# Patient Record
Sex: Female | Born: 1935
Health system: Southern US, Community
[De-identification: ages and names within clinical notes are randomized; demographics above are authoritative.]

---

## 2004-10-23 ENCOUNTER — Ambulatory Visit: Payer: Self-pay | Admitting: Internal Medicine

## 2005-10-31 ENCOUNTER — Ambulatory Visit: Payer: Self-pay | Admitting: Internal Medicine

## 2007-02-10 ENCOUNTER — Ambulatory Visit: Payer: Self-pay | Admitting: Internal Medicine

## 2008-02-11 ENCOUNTER — Ambulatory Visit: Payer: Self-pay | Admitting: Internal Medicine

## 2009-02-13 ENCOUNTER — Ambulatory Visit: Payer: Self-pay | Admitting: Internal Medicine

## 2009-08-29 ENCOUNTER — Ambulatory Visit: Payer: Self-pay | Admitting: Internal Medicine

## 2012-12-24 ENCOUNTER — Inpatient Hospital Stay: Payer: Self-pay | Admitting: Internal Medicine

## 2012-12-24 LAB — COMPREHENSIVE METABOLIC PANEL
Albumin: 4.3 g/dL (ref 3.4–5.0)
Anion Gap: 3 — ABNORMAL LOW (ref 7–16)
BUN: 8 mg/dL (ref 7–18)
Bilirubin,Total: 0.4 mg/dL (ref 0.2–1.0)
Chloride: 96 mmol/L — ABNORMAL LOW (ref 98–107)
Creatinine: 0.74 mg/dL (ref 0.60–1.30)
EGFR (African American): >60
EGFR (Non-African Amer.): >60
Osmolality: 257 (ref 275–301)
SGOT(AST): 23 U/L (ref 15–37)
Sodium: 129 mmol/L — ABNORMAL LOW (ref 136–145)

## 2012-12-24 LAB — URINALYSIS, COMPLETE
Glucose,UR: NEGATIVE mg/dL (ref 0–75)
Ph: 7 (ref 4.5–8.0)
RBC,UR: 2 /HPF (ref 0–5)
Specific Gravity: 1.005 (ref 1.003–1.030)
WBC UR: 2 /HPF (ref 0–5)

## 2012-12-24 LAB — CBC
HCT: 39.8 % (ref 35.0–47.0)
HGB: 13.6 g/dL (ref 12.0–16.0)
MCH: 31.1 pg (ref 26.0–34.0)
MCV: 91 fL (ref 80–100)
Platelet: 201 10*3/uL (ref 150–440)
RBC: 4.37 10*6/uL (ref 3.80–5.20)

## 2012-12-25 LAB — CBC WITH DIFFERENTIAL/PLATELET
Basophil #: 0 10*3/uL (ref 0.0–0.1)
Basophil %: 0.6 %
Eosinophil %: 2.5 %
HCT: 38.4 % (ref 35.0–47.0)
HGB: 13.1 g/dL (ref 12.0–16.0)
Lymphocyte #: 1.3 10*3/uL (ref 1.0–3.6)
MCV: 91 fL (ref 80–100)
Monocyte #: 0.4 x10 3/mm (ref 0.2–0.9)
Neutrophil #: 4.5 10*3/uL (ref 1.4–6.5)

## 2012-12-25 LAB — BASIC METABOLIC PANEL
Chloride: 106 mmol/L (ref 98–107)
Creatinine: 0.87 mg/dL (ref 0.60–1.30)
EGFR (Non-African Amer.): >60
Glucose: 86 mg/dL (ref 65–99)
Osmolality: 274 (ref 275–301)
Potassium: 3.7 mmol/L (ref 3.5–5.1)

## 2012-12-26 LAB — URINE CULTURE

## 2012-12-27 LAB — URINE CULTURE

## 2013-03-13 LAB — LIPASE, BLOOD: Lipase: 77 U/L (ref 73–393)

## 2013-03-13 LAB — CBC
HCT: 39.4 % (ref 35.0–47.0)
MCHC: 35.5 g/dL (ref 32.0–36.0)
MCV: 90 fL (ref 80–100)
Platelet: 204 10*3/uL (ref 150–440)
RBC: 4.37 10*6/uL (ref 3.80–5.20)
RDW: 13.5 % (ref 11.5–14.5)
WBC: 11.1 10*3/uL — ABNORMAL HIGH (ref 3.6–11.0)

## 2013-03-13 LAB — COMPREHENSIVE METABOLIC PANEL
Albumin: 3.2 g/dL — ABNORMAL LOW (ref 3.4–5.0)
Alkaline Phosphatase: 78 U/L (ref 50–136)
BUN: 17 mg/dL (ref 7–18)
Bilirubin,Total: 1 mg/dL (ref 0.2–1.0)
Co2: 27 mmol/L (ref 21–32)
Creatinine: 0.94 mg/dL (ref 0.60–1.30)
EGFR (African American): >60
EGFR (Non-African Amer.): 57 — ABNORMAL LOW
Osmolality: 258 (ref 275–301)
Potassium: 3.6 mmol/L (ref 3.5–5.1)
SGOT(AST): 18 U/L (ref 15–37)

## 2013-03-14 ENCOUNTER — Inpatient Hospital Stay: Payer: Self-pay | Admitting: Surgery

## 2013-03-14 LAB — URINALYSIS, COMPLETE
Bacteria: NONE SEEN
Bilirubin,UR: NEGATIVE
Glucose,UR: NEGATIVE mg/dL (ref 0–75)
Leukocyte Esterase: NEGATIVE
Nitrite: NEGATIVE
Ph: 5 (ref 4.5–8.0)
Protein: 30
RBC,UR: 3 /HPF (ref 0–5)
Specific Gravity: 1.019 (ref 1.003–1.030)
Squamous Epithelial: 1
WBC UR: 1 /HPF (ref 0–5)

## 2013-03-14 LAB — SODIUM: Sodium: 132 mmol/L — ABNORMAL LOW (ref 136–145)

## 2013-03-15 LAB — BASIC METABOLIC PANEL
BUN: 9 mg/dL (ref 7–18)
Calcium, Total: 8.2 mg/dL — ABNORMAL LOW (ref 8.5–10.1)
Co2: 28 mmol/L (ref 21–32)
Creatinine: 0.93 mg/dL (ref 0.60–1.30)
EGFR (African American): >60
EGFR (Non-African Amer.): 58 — ABNORMAL LOW
Glucose: 111 mg/dL — ABNORMAL HIGH (ref 65–99)
Osmolality: 268 (ref 275–301)
Potassium: 3.3 mmol/L — ABNORMAL LOW (ref 3.5–5.1)

## 2013-03-15 LAB — CBC WITH DIFFERENTIAL/PLATELET
Basophil #: 0 10*3/uL (ref 0.0–0.1)
Basophil %: 0.3 %
Eosinophil #: 0.1 10*3/uL (ref 0.0–0.7)
Eosinophil %: 0.8 %
HCT: 35.2 % (ref 35.0–47.0)
Lymphocyte #: 0.8 10*3/uL — ABNORMAL LOW (ref 1.0–3.6)
MCH: 32.1 pg (ref 26.0–34.0)
MCHC: 35.2 g/dL (ref 32.0–36.0)
Monocyte #: 0.7 x10 3/mm (ref 0.2–0.9)
Monocyte %: 9 %
Neutrophil #: 6.2 10*3/uL (ref 1.4–6.5)
Platelet: 170 10*3/uL (ref 150–440)
RDW: 13.5 % (ref 11.5–14.5)
WBC: 7.7 10*3/uL (ref 3.6–11.0)

## 2013-03-15 LAB — HEPATIC FUNCTION PANEL A (ARMC)
Bilirubin, Direct: 0.1 mg/dL (ref 0.00–0.20)
SGPT (ALT): 34 U/L (ref 12–78)
Total Protein: 5.5 g/dL — ABNORMAL LOW (ref 6.4–8.2)

## 2013-03-16 LAB — BASIC METABOLIC PANEL
Anion Gap: 7 (ref 7–16)
Calcium, Total: 8.6 mg/dL (ref 8.5–10.1)
Chloride: 99 mmol/L (ref 98–107)
Co2: 29 mmol/L (ref 21–32)
Creatinine: 0.85 mg/dL (ref 0.60–1.30)
EGFR (Non-African Amer.): >60
Glucose: 83 mg/dL (ref 65–99)
Osmolality: 267 (ref 275–301)
Potassium: 3.6 mmol/L (ref 3.5–5.1)
Sodium: 135 mmol/L — ABNORMAL LOW (ref 136–145)

## 2013-03-16 LAB — CBC WITH DIFFERENTIAL/PLATELET
Basophil #: 0 10*3/uL (ref 0.0–0.1)
Eosinophil #: 0.3 10*3/uL (ref 0.0–0.7)
HCT: 37.2 % (ref 35.0–47.0)
HGB: 12.9 g/dL (ref 12.0–16.0)
Lymphocyte %: 18.1 %
Monocyte %: 6.7 %
Neutrophil #: 5.2 10*3/uL (ref 1.4–6.5)
Platelet: 196 10*3/uL (ref 150–440)
RDW: 13.5 % (ref 11.5–14.5)
WBC: 7.3 10*3/uL (ref 3.6–11.0)

## 2013-11-17 ENCOUNTER — Emergency Department: Payer: Self-pay | Admitting: Emergency Medicine

## 2013-11-18 LAB — COMPREHENSIVE METABOLIC PANEL
ALBUMIN: 3.2 g/dL — AB (ref 3.4–5.0)
Alkaline Phosphatase: 100 U/L
Anion Gap: 6 — ABNORMAL LOW (ref 7–16)
BILIRUBIN TOTAL: 0.3 mg/dL (ref 0.2–1.0)
BUN: 24 mg/dL — AB (ref 7–18)
CREATININE: 1.16 mg/dL (ref 0.60–1.30)
Calcium, Total: 8.9 mg/dL (ref 8.5–10.1)
Chloride: 104 mmol/L (ref 98–107)
Co2: 29 mmol/L (ref 21–32)
EGFR (African American): 51 — ABNORMAL LOW
EGFR (Non-African Amer.): 44 — ABNORMAL LOW
Glucose: 105 mg/dL — ABNORMAL HIGH (ref 65–99)
Osmolality: 282 (ref 275–301)
POTASSIUM: 3.9 mmol/L (ref 3.5–5.1)
SGOT(AST): 17 U/L (ref 15–37)
SGPT (ALT): 20 U/L (ref 12–78)
Sodium: 139 mmol/L (ref 136–145)
Total Protein: 7 g/dL (ref 6.4–8.2)

## 2013-11-18 LAB — URINALYSIS, COMPLETE
Bilirubin,UR: NEGATIVE
Glucose,UR: NEGATIVE mg/dL (ref 0–75)
KETONE: NEGATIVE
Nitrite: NEGATIVE
Ph: 6 (ref 4.5–8.0)
Protein: NEGATIVE
RBC,UR: 1 /HPF (ref 0–5)
Specific Gravity: 1.005 (ref 1.003–1.030)

## 2013-11-18 LAB — CBC WITH DIFFERENTIAL/PLATELET
BASOS ABS: 0 10*3/uL (ref 0.0–0.1)
Basophil %: 0.6 %
EOS ABS: 0.3 10*3/uL (ref 0.0–0.7)
Eosinophil %: 4.9 %
HCT: 38.6 % (ref 35.0–47.0)
HGB: 12.6 g/dL (ref 12.0–16.0)
LYMPHS ABS: 2 10*3/uL (ref 1.0–3.6)
LYMPHS PCT: 33.4 %
MCH: 30.2 pg (ref 26.0–34.0)
MCHC: 32.6 g/dL (ref 32.0–36.0)
MCV: 93 fL (ref 80–100)
MONO ABS: 0.5 x10 3/mm (ref 0.2–0.9)
Monocyte %: 8.6 %
Neutrophil #: 3.1 10*3/uL (ref 1.4–6.5)
Neutrophil %: 52.5 %
PLATELETS: 165 10*3/uL (ref 150–440)
RBC: 4.17 10*6/uL (ref 3.80–5.20)
RDW: 13.8 % (ref 11.5–14.5)
WBC: 6 10*3/uL (ref 3.6–11.0)

## 2013-11-18 LAB — LIPASE, BLOOD: Lipase: 137 U/L (ref 73–393)

## 2013-11-20 LAB — URINE CULTURE

## 2014-08-28 ENCOUNTER — Inpatient Hospital Stay: Payer: Self-pay | Admitting: Internal Medicine

## 2014-11-04 NOTE — Consult Note (Signed)
General Aspect 81 y/o f with hx of Dementia, Hypothyroidism   Present Illness Admitted with recurrent episodes of abdominal pain and nausea or the last 3-4 days U/s shows evidence for disteneded gall bladder and impacted stone in neck. Pt denies fevers, chills, chest pains or shortness of breath EKG: SR. Non specific ST changes     ED ECG, Stat-Interpreted by-.ordering physician-Reason for ecg -abd pain-Show to MD Immediately if over 24 years old., 13-Mar-2013, Active, Standard   No Known Allergies:   Case History and Physical Exam:  Chief Complaint Abdominal Pain  Nausea/Vomiting   Past Medical Health Other, Hypothyroidism, Dementia   Past Surgical History D &C after Miscarriage   Primary Care Provider [facility]   Family History Coronary Artery Disease  Hypertension  Diabetes Mellitus  Cancer   HEENT PERLA   Neck/Nodes Supple   Chest/Lungs Clear   Cardiovascular No Murmurs or Gallops  Normal Sinus Rhythm   Abdomen Benign  Active bowel sounds  No hernias   Musculoskeletal Full range of motion   Neurological Grossly WNL  Dementia   Skin Warm  WNL   Nursing/Ancillary Notes: **Vital Signs.:   31-Aug-14 07:59  Temperature Temperature (F) 98.1  Celsius 36.7  Temperature Source oral  Pulse Pulse 79  Respirations Respirations 18  Systolic BP Systolic BP 546  Diastolic BP (mmHg) Diastolic BP (mmHg) 68  Mean BP 92  Pulse Ox % Pulse Ox % 96  Oxygen Delivery Room Air/ 21 %   Hepatic:  30-Aug-14 23:32   Bilirubin, Total 1.0  Alkaline Phosphatase 78  SGPT (ALT) 16  SGOT (AST) 18  Total Protein, Serum 6.6  Albumin, Serum  3.2  Routine Chem:  30-Aug-14 23:32   Lipase 77 (Result(s) reported on 13 Mar 2013 at 11:52PM.)  Glucose, Serum  118  BUN 17  Creatinine (comp) 0.94  Sodium, Serum  127  Potassium, Serum 3.6  Chloride, Serum  92  CO2, Serum 27  Calcium (Total), Serum 8.6  Osmolality (calc) 258  eGFR (African American) >60  eGFR  (Non-African American)  57 (eGFR values <49m/min/1.73 m2 may be an indication of chronic kidney disease (CKD). Calculated eGFR is useful in patients with stable renal function. The eGFR calculation will not be reliable in acutely ill patients when serum creatinine is changing rapidly. It is not useful in  patients on dialysis. The eGFR calculation may not be applicable to patients at the low and high extremes of body sizes, pregnant women, and vegetarians.)  Anion Gap 8  Cardiac:  30-Aug-14 23:32   Troponin I < 0.02 (0.00-0.05 0.05 ng/mL or less: NEGATIVE  Repeat testing in 3-6 hrs  if clinically indicated. >0.05 ng/mL: POTENTIAL  MYOCARDIAL INJURY. Repeat  testing in 3-6 hrs if  clinically indicated. NOTE: An increase or decrease  of 30% or more on serial  testing suggests a  clinically important change)  Routine UA:  31-Aug-14 23:41   Color (UA) Yellow  Clarity (UA) Clear  Glucose (UA) Negative  Bilirubin (UA) Negative  Ketones (UA) 1+  Specific Gravity (UA) 1.019  Blood (UA) 2+  pH (UA) 5.0  Protein (UA) 30 mg/dL  Nitrite (UA) Negative  Leukocyte Esterase (UA) Negative (Result(s) reported on 14 Mar 2013 at 12:35AM.)  RBC (UA) 3 /HPF  WBC (UA) 1 /HPF  Bacteria (UA) NONE SEEN  Epithelial Cells (UA) 1 /HPF  Mucous (UA) PRESENT (Result(s) reported on 14 Mar 2013 at 12:35AM.)  Routine Hem:  30-Aug-14 23:32  WBC (CBC)  11.1  RBC (CBC) 4.37  Hemoglobin (CBC) 14.0  Hematocrit (CBC) 39.4  Platelet Count (CBC) 204 (Result(s) reported on 13 Mar 2013 at 11:45PM.)  MCV 90  MCH 32.1  MCHC 35.5  RDW 13.5   [place]:    31-Aug-14 02:40, US Abdomen Limited Survey  US Abdomen Limited Survey   REASON FOR EXAM:    RUQ PAIN  COMMENTS:   Body Site: GB and Fossa, CBD, Head of Pancreas    PROCEDURE: [place]  - US ABDOMEN LIMITED SURVEY  - Mar 14 2013  2:40AM     RESULT: A limited upper abdominal ultrasound examination was performed.    The gallbladder is distended and there is a  calcified stone which appears   impacted in the neck. An additional large stones are demonstrated. The   impacted stone measures 1.9 cm in greatest dimension. There is no   positive sonographic Murphy's sign. There is pericholecystic fluid and   there is mild gallbladder wall thickening to 4 0.4 mm. The common bile   duct is normal at 1.7 mm in diameter.    The liver exhibits normal echotexture with no focal mass or ductal     dilation. Portal venous flow is normal in direction toward the liver. The   pancreas demonstrates normal echotexture and contour.    IMPRESSION:   1. There is gallbladder distention with an impacted stone in the neck   with other stones present. There is gallbladder wall thickening and   pericholecystic fluid. Though there is no positive sonographic Murphy's   sign findings suggest cholelithiasis and cholecystitis.  2. The liver and pancreas and common bile duct exhibit no acute   abnormalities.     Dictation Site: 1        Verified [place]:OVZCH A. [place], M.D., MD    Impression Nausea and vomiting secondary to gall stones. Dementia: Stable- On Divalproex and prn Haldol and Ativan Hypothyroidism: On Levothyroxine Hyponatremia;  May be related to meds -Continue to monitor. Change IV fluids to N.saline   Plan Pt is stable to undergo surgery. No further cardiac work up indicated at this time Risk of peri operative complications is moderate given her age and underlying medical condition   Electronic Signatures: Tracie Harrier (MD)  (Signed 31-Aug-14 10:37)  Authored: General Aspect/Present Illness, Orders, Allergies, History and Physical Exam, Vital Signs, Labs, Radiology, Impression/Plan   Last Updated: 31-Aug-14 10:37 by Tracie Harrier (MD)

## 2014-11-04 NOTE — Discharge Summary (Signed)
PATIENT NAME:  Julia Gray, [name] MR#:  [id number] DATE OF BIRTH:  [birth year]  DATE OF ADMISSION:  03/14/2013 DATE OF DISCHARGE:  03/16/2013  PREOPERATIVE DIAGNOSIS: Acute cholecystitis.   ADDITIONAL DISCHARGE DIAGNOSES: 1.  Possible hysterectomy.  2.  Hypothyroidism.  3.  History of mental status changes.  4.  History of hyponatremia.   DISCHARGE MEDICATIONS:  1.  Vitamin D3 50,000 units 1 tab p.o. q. weekly on Thursday.  2.  L-thyroxine 75 mcg p.o. daily.  3.  Aspirin 81 mg p.o. daily.  4.  Depakote sprinkles 125 mg p.o. b.i.d.   5.  Zyrtec 5 mg p.o. daily. 6.  Ativan 0.5 mg b.i.d. p.r.n. anxiety and nervousness. 7.  Trazodone 100 mg p.o. at bedtime.  8.  Norco 1 tab p.o. q. 4 hours p.r.n. pain.   INDICATION FOR ADMISSION: [name] is a pleasant 81 year old female with history of recurrent abdominal pain and was noted to have an impacted cystic duct stone on ultrasound and mild leukocytosis. She was thus brought to the operating room suite for laparoscopic cholecystectomy.   HOSPITAL COURSE:  [name] was admitted on 08/31 and was given IV fluids and IV antibiotics. She later underwent laparoscopic cholecystectomy. Throughout hospital course, her diet was advanced to regular and she was transitioned to p.o. pain meds. At the time of discharge, she was not taking ideal p.o. intake, but had no nausea or vomiting. Did not have a bowel movement for the past few days so is being treated with Dulcolax suppository, but otherwise in satisfactory condition.   DISCHARGE INSTRUCTIONS: [name] was to follow with me in approximately 1 week. She is to call or return to the ED if she has increased pain, nausea, vomiting, redness or drainage from incision.  ____________________________ Si Raiderhristopher A. Shaqueta Casady, MD cal:sb D: 03/16/2013 10:54:04 ET T: 03/16/2013 11:09:33 ET JOB#: [id number]  cc: Cristal Deerhristopher A. Armondo Cech, MD, <Dictator> Jarvis NewcomerHRISTOPHER A Yonatan Guitron MD ELECTRONICALLY SIGNED  03/19/2013 8:13

## 2014-11-04 NOTE — H&P (Signed)
PATIENT NAME:  Julia Gray, Julia Gray MR#:  [id number] DATE OF BIRTH:  Oct 25, 1930  DATE OF ADMISSION:  03/14/2013  PRIMARY CARE PHYSICIAN:  John B. Danne Harbor, M.D.    ADMITTING PHYSICIAN:  Quentin Ore, III, M.D.  CHIEF COMPLAINT: Abdominal pain.   BRIEF HISTORY: Julia Gray is an 81 year old woman seen in the Emergency Room with a several day history of abdominal pain, nausea and vomiting. She lives in an assisted living facility because of her current dementia. The facility notified the family today that she had been having nausea, vomiting over the last several days, but implied that the symptoms were intermittent. The patient was seen in [facility] today. She was noted to have some blood in her urine and, therefore, treated as a urinary tract infection placing the patient on antibiotics. She went back to the assisted living facility. The family was notified this evening that she was continuing to have abdominal pain, in addition to her nausea, vomiting, and the on-call physician suggested the Emergency Room evaluation.   She presented to the Emergency Room for further investigation. Workup revealed a slightly elevated white blood cell count at 11,000. Electrolytes were unremarkable with the exception of sodium of 127. Liver function studies were unremarkable. Because of her abdominal pain, nausea and vomiting, ultrasound was performed which demonstrated cholelithiasis and a possible impacted cystic duct stone. There was no sign of any pericholecystic fluid, gallbladder wall thickening or sonographic Murphy's sign. The surgical service was consulted.   The patient is a very poor historian from her dementia. Her son is present and assisted with the interview. She has no known history of biliary tract disease. She has no history of other GI problems, including hepatitis, yellow jaundice, pancreatitis, peptic ulcer disease, previous diagnosis of gallbladder disease or  diverticulitis. Her chart carries no surgical history, although the son believes she had some sort of surgery almost 50 years ago. He is concerned it may have been a hysterectomy. She has no history of cardiac disease. She has no hypertension or diabetes, but she is hypothyroid. She has history of previous admission this year for mental status changes and hyponatremia. She was moved to an assisted  living facility at that time. She is regularly  followed by Dr. Yates Decamp at the [facility].    ADMISSION MEDICATIONS:  Include aspirin 81 mg once a day, Depakote sprinkles 125 mg b.i.d., levothyroxine 0.075 mg once a day, lorazepam 0.5 twice a day p.r.n., trazodone 100 mg at bedtime, Tylenol 650 2 tablets every 6 hours p.r.n. and Zyrtec 10 mg 1/2 tablet once a day.    ALLERGIES:  She is not allergic to any medications.   SOCIAL HISTORY: She is a former smoker, but has not smoked in years.   REVIEW OF SYSTEMS: Impossible in in this setting because of the patient's dementia. The son is unaware of any other symptoms or nor any problems with her urinary tract function at this time.   PHYSICAL EXAMINATION: GENERAL: She is lying comfortably in bed. She is cooperative, but confused.  VITAL SIGNS: Blood pressure is 131/68. Heart rate is 82 and regular. She denies any pain.  HEENT: No scleral icterus. No pupillary abnormalities. No facial deformities.  NECK: Supple, nontender with midline trachea and no adenopathy.  CHEST: Clear with no adventitious sounds and normal pulmonary excursion.  CARDIAC: No murmurs or gallops to my ear, and seems to be in normal sinus rhythm. She has no carotid bruits.  ABDOMINAL:  Reveals some moderate right upper quadrant, right flank tenderness. I cannot palpate her gallbladder. She has hypoactive, but present bowel sounds. She guards on the right side, but has no rebound and no hernias are noted.  EXTREMITIES: Full range of motion. No deformities, good distal pulses.   PSYCHIATRIC: Reveals very poor orientation, but a cooperative dementia. Her affect is flat.   ASSESSMENT AND PLAN: This presentation would suggest possible biliary tract disease. We will admit her to the hospital, place her on IV antibiotics, rehydrate her hyponatremia and have t medicine see her with regard to risk assessment for surgery. I discussed this plan with the patient's son and he is in agreement.    ____________________________ Carmie Endalph L. Ely III, MD rle:nts D: 03/14/2013 04:47:40 ET T: 03/14/2013 05:32:49 ET JOB#: [id number]  cc: Quentin Orealph L. Ely III, MD, <Dictator> Quentin OreALPH L ELY MD ELECTRONICALLY SIGNED 03/15/2013 4:29

## 2014-11-04 NOTE — Discharge Summary (Signed)
PATIENT NAME:  Julia Gray, Julia Gray MR#:  [id number] DATE OF BIRTH:  May 14, 1931  DATE OF ADMISSION:  12/24/2012 DATE OF DISCHARGE:  12/28/2012  HISTORY OF PRESENT ILLNESS:  Julia Gray is a 81 year old white lady with known severe dementia who was brought to the Emergency Room after she had a sudden change in mental status. The patient had been extremely confused and was hallucinating. In the Emergency Room, the patient was found to have mild hyponatremia and evidence of a urinary tract infection.   PAST MEDICAL HISTORY: Notable for: 1.  Senile dementia.  2.  Depression.  3.  Osteoporosis.  4.  Hypothyroidism.   ALLERGIES: No known drug allergies.   HOME MEDICATIONS: Include Aleve 220 mg daily, Synthroid 75 mcg daily, Lorazepam 0.5 mg every 6 hours p.r.n. anxiety, vitamin D 50,000 units once a week on Thursdays and Zyrtec 10 mg daily.   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 She had not smoked in many years. She did not use any alcohol. She was currently living with her daughter, but was left unattended for long periods during the day as her family all work.   ADMISSION PHYSICAL EXAMINATION: Revealed a temperature 97.9, a pulse of 74, respirations of 16 and a blood pressure of 163/72. Pulse ox was 98% on room air. Examination as described by the admitting physician was most notable for a grade 3/6 systolic murmur. Specifically, neurological examination was unremarkable. The patient was noted to be alert, but oriented to self only.   LABORATORY AND DIAGNOSTICS: The patient's admission CBC showed a hemoglobin of 13.6 with a hematocrit of 39.8. White count was 6400. Platelet count was 201,000. Admission comprehensive metabolic panel was notable only for a sodium of 129 and a chloride of 96. TSH was 2.02. Urinalysis showed 1+ leukocyte esterase.   Chest x-ray showed possible underlying COPD, but was otherwise unremarkable. Carotid Doppler's showed no significant  stenosis. Unenhanced head CT in the Emergency Room showed diffuse cerebral and cerebellar atrophy, but no acute changes.   HOSPITAL COURSE: The patient was admitted to the regular floor where her hyponatremia was corrected with saline IV. Blood and urine cultures were obtained on admission and the patient was started on Septra double strength b.i.d. for suspected urinary tract infection. Blood cultures eventually showed no growth. Urine culture unfortunately showed mixed organisms suggestive of contamination. The patient's hallucinations, etc., resolved during her hospitalization but she remained severely demented and became agitated at times. This was controlled with p.o. Haldol. The patient was also seen during her hospitalization by physical therapy for reconditioning. The patient's final chemistry panel showed a sodium of 138 with a chloride of 106. The remainder of the panel was also within normal limits.   DISCHARGE DIAGNOSES: 1.  Acute metabolic encephalopathy secondary to hyponatremia and urinary tract infection.  2.  Urinary tract infection.  3.  Hyponatremia, resolved.  4.  Senile dementia.  5.  Hypothyroidism.  6.  Osteoporosis.   DISCHARGE MEDICATIONS: 1.  Tylenol 650 mg q. 4 hours as needed for pain or fever.  2.  Aspirin 81 mg daily.  3.  Vitamin D2 50,000 units weekly.  4.  Levoxyl 75 mcg daily.  5.  Protonix 40 mg daily.  6.  Trazodone 50 mg at bedtime.  7.  Bactrim double strength 1 tablet b.i.d. for an additional 3 days.  8.  Haldol 2 mg p.o. q. 2 hours p.r.n. agitation.   DISCHARGE DISPOSITION: The patient is being  discharged on a regular diet with activity as tolerated. She does remain a FULL CODE.  ____________________________ Letta PateJohn B. Danne HarborWalker III, MD jbw:sb D: 12/28/2012 07:22:04 ET T: 12/28/2012 07:35:00 ET JOB#: [id number]  cc: Letta PateJohn B. Danne HarborWalker III, MD, <Dictator> Elmo PuttJOHN B WALKER III MD ELECTRONICALLY SIGNED 01/04/2013 22:27

## 2014-11-04 NOTE — Op Note (Signed)
PATIENT NAME:  Julia HeinzMCPHERSON, Julia H MR#:  [id number] DATE OF BIRTH:  1930-09-01  DATE OF PROCEDURE:  03/14/2013  PREOPERATIVE DIAGNOSIS: Acute cholecystitis.   POSTOPERATIVE DIAGNOSIS: Acute on chronic cholecystitis.   PROCEDURE PERFORMED: Laparoscopic cholecystectomy.   SURGEON: Seanmichael Salmons A. Stacee Earp, M.D.   ANESTHESIA: General.   ESTIMATED BLOOD LOSS: 50 mL.   SPECIMEN: Gallbladder.   INDICATION FOR SURGERY: [name] is a pleasant 81 year old female who presents with recurrent epigastric and right upper quadrant pain. She was noted to have a nonmobile stone in the neck of her gallbladder and gallbladder wall thickening. She was thus brought to the operating room for laparoscopic cholecystectomy.   DETAILS OF PROCEDURE: Informed consent was obtained. [name] was laid supine on the operating room table. She was induced. Endotracheal tube was placed. General anesthesia was administered. Her abdomen was then prepped and draped in standard surgical fashion. A timeout was then performed correctly identifying the patient name, operative site and procedure to be performed. A supraumbilical incision was made. This was deepened down to the fascia. The fascia was incised. The peritoneum was entered. Two stay sutures were placed through the fascia. Hassan trocar was placed in the abdomen. The abdomen was insufflated. The gallbladder was evaluated. It was noted to be taut and fluid-filled and erythematous. I thus placed an 11 mm epigastric port and two 5 mm subcostal ports at the midclavicular and anterior axillary line. The gallbladder was then aspirated using an ovarian cyst aspirator so it could be grasped. It was then lifted over the dome of the liver. The cystic duct and cystic artery were dissected out. These were clipped. Critical view was obtained. These were clipped 3 times and then ligated. The gallbladder was then taken off the gallbladder fossa using hook electrocautery. The gallbladder  was then taken out with an Endo Catch bag through the supraumbilical port site. It did have to be opened and enlarged to be able to accommodate the gallbladder, which was large and entirely filled with stones and appeared to be even calcified. The gallbladder fossa was then examined. It was made hemostatic with hook and electrocautery. The abdomen was irrigated. The trocars were then taken out under direct visualization when hemostasis was obtained. The supraumbilical port site was closed with 3 figure-of-eight 0 Vicryls. The rest of the port site skin was then closed using interrupted 4-0 Monocryl deep dermal sutures. Steri-Strips, Telfa gauze and Tegaderm were then placed over the wounds. The patient was then awoken, extubated and brought to the postanesthesia care unit. There were no immediate complications. Needle, sponge and instrument counts were correct at the end of the procedure.  ____________________________ Si Raiderhristopher A. Monterio Bob, MD cal:sb D: 03/14/2013 21:13:58 ET T: 03/15/2013 10:03:32 ET JOB#: [id number]  cc: Cristal Deerhristopher A. Chalisa Kobler, MD, <Dictator> Jarvis NewcomerHRISTOPHER A Demontrez Rindfleisch MD ELECTRONICALLY SIGNED 03/19/2013 8:13

## 2014-11-04 NOTE — H&P (Signed)
PATIENT NAME:  Julia Gray, Julia Gray MR#:  [id number] DATE OF BIRTH:  [birth year]  DATE OF ADMISSION:  12/24/2012  ADMITTING PHYSICIAN: Enid Baas, MD  PRIMARY CARE PHYSICIAN: Yates Decamp III, MD   CHIEF COMPLAINT: Altered mental status.  HISTORY OF PRESENT ILLNESS: Julia Gray is a 81 year old Caucasian female with past medical history significant for dementia, depression and osteoporosis who lives at home with her daughter who was brought in secondary to sudden change in mental status going on since yesterday. The patient is confused and most of the history obtained by daughter at bedside. According to the daughter, the patient's dementia has been getting worse over the past 2 years which is why she moved in with her mom and since the last couple of months she has been getting more confused, talking to people who are not there, and her gait has been more shuffling and very unsteady.  During the daytime daughter goes to work and there is nobody to care for the patient.  She has also been having some insomnia and since yesterday she has been very agitated, confused, so was brought into the ER today. Labs on blood work revealed a sodium of 129, however, according to the patient her appetite and p.o. fluid intake has been normal.  PAST MEDICAL HISTORY: 1.  Dementia.  2.  Depression.  3.  Osteoporosis.  4.  Hypothyroidism.   PAST SURGICAL HISTORY: None.  ALLERGIES: No known drug allergies.  HOME MEDICATIONS: 1.  Aleve 220 mg p.o. daily.  2.  Synthroid 75 mcg p.o. daily.  3.  Ativan 0.5 mg q. 6 hours p.r.n. for anxiety, nervousness.  4.  Vitamin D 2000 international unit capsule once a week on Thursdays.  5.  Zyrtec 10 mg tablet p.o. daily.   SOCIAL HISTORY: Currently living at home and her daughter lives with her.  Quit smoking several years ago. She is a retired Engineer, civil (consulting). No alcohol use.   FAMILY HISTORY: Stroke runs in the family. Sisters with heart disease and diabetes.   REVIEW OF  SYSTEMS:  Difficult to be obtained secondary to the patient's mental status.   PHYSICAL EXAMINATION: VITAL SIGNS: Temperature 97.9 degrees Fahrenheit, pulse 74, respirations 16, blood pressure 163/72 and pulse ox 98% on room air.  GENERAL: Well-built, well-nourished thin female lying in bed, not in any acute distress.  HEENT: Normocephalic, atraumatic. Pupils are equal, round and reacting to light. Anicteric sclerae. Extraocular movements intact. Oropharynx clear without erythema, mass or exudates.  NECK: Supple. No thyromegaly, JVD or carotid bruits. No lymphadenopathy.  LUNGS: Moving air bilaterally. No wheeze or crackles. No use of accessory muscles for breathing.  HEART:  S1 and S2 regular rate and rhythm, 3/6 systolic murmur present. No rubs or gallops. BREASTS:  Does not reveal any external masses.  LYMPHATICS: No cervical or inguinal lymphadenopathy.  ABDOMEN: Soft, nontender and nondistended. No hepatosplenomegaly.  Normal bowel sounds.  EXTREMITIES: No pedal edema. No clubbing or cyanosis. 1+ dorsalis pedis pulses palpable bilaterally.  SKIN: No acne, rash or lesions.  NEUROLOGIC: Cranial nerves seem to be intact. She has good strength in all 4 extremities, symmetric, and sensation seems to be intact, and her knee jerks are 2+ and biceps are 2+, equally bilaterally.  PSYCH:  The patient seems to be alert and oriented to self.   LABORATORY AND DIAGNOSTICS: Urinalysis with 1+ leukocyte esterase, 2 WBCs, no bacteria.  CT of the head is showing no acute ischemic or hemorrhagic infarction. No mass effect.  Mild stable  diffuse cerebral and cerebellar atrophy with compensated ventriculomegaly seen.   WBC 6.4, hemoglobin 13.6, hematocrit 39.8, platelet count 201.   Sodium 129, potassium 4.1, chloride 96, bicarb 30, BUN 8, creatinine 0.74, glucose 91 and calcium 9.4.   ALT 20, AST 23, alkaline phosphatase 66, total bilirubin 0.4 and albumin of 4.3. TSH within normal limits at 2.02.     ASSESSMENT AND PLAN: this is a 81 year old female with progressively worsening dementia, depression and osteoporosis who was brought in from home secondary to worsening of her confusion.  1.  Acute change in mental status which could be secondary to worsening of dementia versus hyponatremia versus a transient ischemic attack. Will admit to the regular floor, continue IV fluids and check carotid Dopplers. CT of the head is showing generalized cerebral and cerebellar atrophy and no infarct.  Will add an aspirin. Continue to monitor to prevent falls. She is on Ativan p.o. p.r.n., which we will continue.  Will get physical therapy consult and she will likely need placement. Her husband also has dementia. [facility], and the patient could go there as well. 2.  Hypothyroidism. Continue her Synthroid. TSH is within normal limits.  3.  Osteoporosis. Continue vitamin D supplement.  4.  For her insomnia, added trazodone.  May help be helpful in getting some sleep and IV fluids might help with her confusion.    CODE STATUS:  FULL CODE.   TIME SPENT ON ADMISSION: 50 minutes. ____________________________ Enid Baasadhika Lavaun Greenfield, MD rk:sb D: 12/24/2012 16:11:38 ET T: 12/24/2012 16:34:45 ET JOB#: [id number]  cc: Enid Baasadhika Kimyah Frein, MD, <Dictator> John B. Danne HarborWalker III, MD Enid BaasADHIKA Annlee Glandon MD ELECTRONICALLY SIGNED 01/04/2013 13:52

## 2014-11-13 NOTE — Op Note (Signed)
PATIENT NAME:  Julia [name], Julia [name] MR#:  [id number] DATE OF BIRTH:  30-Jul-1930  DATE OF PROCEDURE:  08/28/2014  PREOPERATIVE DIAGNOSIS: Left displaced intertrochanteric hip fracture.   POSTOPERATIVE DIAGNOSIS: Left displaced intertrochanteric hip fracture.   PROCEDURE: Intramedullary fixation for left intertrochanteric hip fracture.   SURGEON: Kathreen DevoidKevin L. Elianys Conry, MD   ANESTHESIA: Spinal.   ESTIMATED BLOOD LOSS: 50 mL.   COMPLICATIONS: None.   IMPLANTS: Biomet 9 x 180 mm, 125 degree short Affixus nail with a 95 mm lag screw, and 34 mm distal interlocking screw.  INDICATIONS FOR SURGERY: The patient is an 81 year old female with dementia, who is ambulatory at baseline. She often uses a walker for assistance with ambulation. She sustained  a mechanical fall at her assisted living facility and was diagnosed with a displaced intertrochanteric hip fracture on the left side by x-ray. Given the patient's ambulatory status at baseline and the displaced nature of this fracture, I have recommended surgical fixation with an intramedullary rod. I spoke with the patient's daughter prior to surgery and I reviewed the risks and benefits of the procedure.  She understands the risks include, but are not limited to, infection, bleeding, nerve or blood vessel injury, malunion, nonunion, leg length discrepancy, change in lower extremity rotation, persistent left hip pain and, failure to return to her previous ambulatory status and the need for further surgery. Medical risks include, but are not limited to, DVT and pulmonary embolism, myocardial infarction, stroke, pneumonia, respiratory failure. and death.   PROCEDURE NOTE: The patient was marked with my initials and the word "yes" according to the hospital's correct site of surgery protocol. She was brought to the operating room where she underwent a spinal anesthetic by the anesthesia service. She was then positioned supine on the fracture table. The left leg was  placed in a legholder and longitudinal traction and internal rotation was applied. The right leg was placed in a hemi-lithotomy position. All bony prominences were adequately padded. C-arm images were taken to confirm closed reduction of the fracture. The patient was then prepped and draped in sterile fashion. A timeout was performed to verify the patient's name, date of birth, medical record number, correct site of surgery, and correct procedure to be performed. It was also used to verify the patient had received antibiotics and appropriate instruments, implants, and radiographic studies were available in the room. Once all in attendance were in agreement, the case began.   The bony landmarks were drawn out with a surgical marker based upon C-arm images. The proposed incisions were based upon these bony landmarks. A #10 blade was used to make the initial incision superior to the tip of the greater trochanter in line with the femur. The deep fascia was then incised with a deep #10 blade. The abductor muscle was bluntly split in line with its fibers to allow for palpation of the tip of the greater trochanter. A threaded guide pin was then inserted on the tip of the greater trochanter and advanced in the proximal femur. Its position was confirmed on AP and lateral C-arm images. This was then overdrilled. A 9 x 180 mm, 125 degree short Affixus nail was then inserted through this drill hole into the femur. Its position again was confirmed on AP and lateral C-arm images. Once the nail was in adequate position, a small stab incision was made along the lateral femur to allow for placement of a lag screw drill guide. A threaded guide pin was then advanced through the lateral cortex  of the femur, across the fracture site and into the femoral head. Its position was confirmed on AP and lateral C-arm images. A tip apex distance of less than 25 mm was achieved. The depth of this guidepin was measured to be 95 mm. This guide  pin was then overdrilled with the drill bit for the lag screw to a depth of 95 mm. A 95 mm lag screw was then advanced into position by hand. Its position was also confirmed on the AP and lateral C-arm images. Compression was applied through the lag screw to allow for anatomic reduction of the fracture. The set screw was then tightened at the top of the Affixus nail and backed off a quarter turn to allow for compression during weight-bearing.  Traction had been released during compression of the fracture.   Finally, the attention was turned to placement of the distal interlocking screw. A drill guide for the interlocking screw was placed through the Affixus guide arm. A drill bit was used to drill bicortically. The length of the screw was measured to be 34 mm. This was then advanced into position by hand through the Affixus guide arm. Its final position was again confirmed on C-arm imaging. The Affixus guide arm was then released and removed from the Affixus nail. Final C-arm images of the intramedullary construct were taken in both the AP and lateral planes.   All wounds were copiously irrigated. The deep fascia of the proximal most incision was closed with an interrupted 0 Vicryl, the subcutaneous tissue was closed with 2-0 Vicryl and the skin approximated on all 3 incisions with staples. A dry sterile dressing was applied. The patient was then transferred to a hospital bed and brought to the PACU in stable condition. I was scrubbed and present for the entire case, and all sharp and instrument counts were correct at the conclusion of the case. I spoke with the patient's family in the surgical waiting room to let them know the case had gone without complication. The patient was stable in the recovery room.    ____________________________ Kathreen Devoid, MD klk:LT D: 08/28/2014 18:02:09 ET T: 08/28/2014 19:35:29 ET JOB#: [id number]  cc: Kathreen Devoid, MD, <Dictator> Kathreen Devoid  MD ELECTRONICALLY SIGNED 09/05/2014 17:22

## 2014-11-13 NOTE — Consult Note (Signed)
Brief Consult Note: Diagnosis: Left intertochanteric hip fracture.   Patient was seen by consultant.   Consult note dictated.   Recommend to proceed with surgery or procedure.   Orders entered.   Comments: Patient has left intertrochanteric hip fracture.  I have recommended surgical fixation.  Surgical site signed as per "right site surgery" protocol.  Surgery scheduled for later today.  Electronic Signatures: Juanell FairlyKrasinski, Wadie Mattie (MD)  (Signed 14-Feb-16 12:18)  Authored: Brief Consult Note   Last Updated: 14-Feb-16 12:18 by Juanell FairlyKrasinski, Alysa Duca (MD)

## 2014-11-13 NOTE — Discharge Summary (Signed)
PATIENT NAME:  Julia [name], Julia [name] MR#:  [id number] DATE OF BIRTH:  01/26/1931  DATE OF ADMISSION:  08/28/2014 DATE OF DISCHARGE:  08/31/2014  ADMITTING DIAGNOSIS: Fall.   DISCHARGE DIAGNOSES: 1.  Fall with a left-sided hip fracture status post repair.  2.  Hypothyroidism.  3.  Osteoporosis.  4.  Dementia, unspecified.  5.  Depression.  6.  Acute blood loss anemia.   CONSULTANTS: Dr. Martha ClanKrasinski.  PROCEDURE: Left displaced intertrochanteric hip fracture.   PERTINENT LABORATORIES AND EVALUATIONS: Admitting glucose 119, BUN 17, creatinine 1.10, sodium 143, potassium 3.9, chloride 104, CO2 was 27, calcium 9.1. Hemoglobin on admission 13.5, hemoglobin on discharge 10.3. Vitamin D level was 19.5. CT of the head without contrast showed no evidence of traumatic intracranial injury or fracture, moderate cortical volume loss, mild degenerative changes in the C-spine region. Left hip x-ray shows status post ORIF. No evidence of dislocation.   HOSPITAL COURSE: Please refer to [name] and P done by the admitting physician. The patient is an 81 year old white female who presented after a fall and was noted to have a left femur fracture. The patient was admitted under service and cleared for surgery. She underwent surgery on the 14th. The patient has had some confusion related to her dementia. However, she is currently stable for discharge.   DISCHARGE MEDICATIONS: Vitamin D3 at 50,000 international units once a week on Thursday, aspirin 81 mg 1 tablet p.o. daily, trazodone 100 at bedtime, omeprazole 20 daily, MiraLax 17 grams daily, milk of magnesia as needed at bedtime, Zofran 4 mg q. 8 p.r.n. nausea, cetirizine 10 daily, valproic acid 125 one tablet p.o. b.i.d., lorazepam 0.5 daily as needed, Premarin vaginally 2 times a week, Rasamine apply topical to the perineum area 4 times a day, Imodium 2 mg 1 tablet p.o. q. 8 for loose stools, Tylenol 650 q. 4 p.r.n., levothyroxine 112 mcg daily, Lovenox 30 mg subcutaneous  b.i.d. x 21 days, iron sulfate 325 mg 1 tab p.o. b.i.d., senna 1 tablet p.o. b.i.d., bisacodyl 10 mg suppositories at bedtime, Colace 240 mg 1 tablet p.o. at bedtime, calcium plus vitamin D 1 tablet p.o. b.i.d.   DIET: Low sodium.   ACTIVITY: As tolerated. PT evaluation and treatment.   FOLLOWUP: With skilled nursing facility in 1 to 2 weeks. Please refer to orthopedic discharge instructions for followup and wound care.   TIME SPENT: 35 minutes spent.    ____________________________ [name]. [name], MD shp:at D: 08/31/2014 11:12:48 ET T: 08/31/2014 11:28:08 ET JOB#: [id number]  cc: Julia Vinas [name]. [name], MD, <Dictator> Julia CarwinSHREYANG [name] Tanijah Morais MD ELECTRONICALLY SIGNED 09/02/2014 15:56

## 2014-11-13 NOTE — Consult Note (Signed)
[name] NAME:  Julia [name], Julia [name] MR#:  [id number] DATE OF BIRTH:  Jul 14, 1931  DATE OF CONSULTATION:  08/28/2014  REFERRING PHYSICIAN:   CONSULTING PHYSICIAN:  Kathreen Devoid, MD  REASON FOR CONSULTATION: Left intertrochanteric hip fracture.   HISTORY OF PRESENT ILLNESS: Julia [name] is an 81 year old female who sustained a fall prior to presentation at [facility] Emergency Room. She is unable to provide an accurate history and Julia circumstances surrounding her fall are unclear. I spoke with her daughter on Julia phone, who states that she, at times, uses a walker, but other times forgets to use it, which may have contributed to her fall. Julia [name] was diagnosed with a left intertrochanteric hip fracture which is displaced on x-ray. Orthopedics is consulted for management of this fracture.   PAST MEDICAL HISTORY: Includes hypothyroidism, osteoporosis, dementia and depression.   PAST SURGICAL HISTORY: Includes cholecystectomy per her daughter.   SOCIAL HISTORY: Julia [name] lives in assisted living; again, according to her daughter.   MEDICATIONS: Include aspirin 81 mg daily, Tylenol 500 mg 1 tablet every 4 hours as needed for pain, Maalox 30 mL q.i.d. as needed for indigestion, divalproex sodium 125 mg delayed release capsule 1 capsule p.o. b.i.d., lorazepam 0.5 mg 1 tablet daily as needed for anxiety, trazodone 100 mg 1 tablet p.o. at bedtime, Imodium 2 mg 1 tablet up to 8 times a day after each loose stool as needed, Formula EM 15 mL every 2 to 3 hours as needed for nausea and vomiting, Zofran 4 mg 1 tablet q.8 h. p.r.n. nausea and vomiting, Cetirizine 10 mg 1 tablet p.o. daily, Risamine 0.44%/20.625% topical ointment applied topically to Julia perineum 4 times daily, Robitussin 100 mcg/5 mL at 10 mL q.6 h. p.r.n. for cough, polyethylene glycol oral powder 1 packet to 8 ounces of water daily, omeprazole 20 mg daily, Premarin vaginal cream 0.625 mg/gram with applicator to be used twice a week  intravaginally, levothyroxine 100 mcg 1 tablet p.o. daily, and vitamin D3 at 50,000 international units 1 capsule p.o. weekly on Thursdays.   ALLERGIES: No known drug allergies.   PHYSICAL EXAMINATION: GENERAL: Julia [name] is seen in her hospital room. She is in no acute distress. She is alert and follows commands. She is unable, however, to provide an accurate history.  MUSCULOSKELETAL: Julia [name]'s left hip has intact skin. There is no erythema, ecchymosis or swelling. Her thigh and leg compartments are soft and compressible. She has palpable pedal pulses, intact sensation to light touch, and intact motor function. Her left lower extremity has shortening and externally rotated, and her left lower leg is in Buck's traction. Julia [name] has no other complaints of pain.   RADIOLOGY: X-ray films of Julia pelvis and left hip reveal a displaced intertrochanteric hip fracture.   ASSESSMENT: Displaced intertrochanteric left hip fracture.   PLAN: Julia [name] has sustained a displaced hip fracture. I have recommended intramedullary fixation for this. I spoke with her daughter over Julia phone. Her daughter was a previous Engineer, civil (consulting) here at [facility]. 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 I explained Julia risks and benefits of surgery with her, along with Julia details of what surgery would entail. I have also explained Julia postoperative course. She understands Julia risks of Julia surgery include, but are not limited to, infection, bleeding, nerve or blood vessel injury, malunion, nonunion, leg length discrepancy or change in lower extremity rotation, persistent left hip pain, failure to return to ambulatory status, and Julia need for further  surgery. Medical risks include, but are not limited to DVT and pulmonary embolism, myocardial infarction, stroke, pneumonia, respiratory failure and death. Julia daughter understands these risks and wished to proceed.   I reviewed Julia laboratories and radiographic  studies of Julia left hip in preparation for this case. Julia left hip was marked with my initials and Julia word "yes" according to Julia hospital's correct site of surgery protocol. She has been n.p.o. and is cleared by Julia hospitalist service.    ____________________________ Kathreen DevoidKevin L. Karinne Schmader, MD klk:JT D: 08/28/2014 12:37:08 ET T: 08/28/2014 12:58:46 ET JOB#: [id number]  cc: Kathreen DevoidKevin L. Karmin Kasprzak, MD, <Dictator> Kathreen DevoidKEVIN L Canda Podgorski MD ELECTRONICALLY SIGNED 09/07/2014 14:45

## 2014-11-13 NOTE — H&P (Signed)
PATIENT NAME:  Julia [name], [name]:  [id number] DATE OF BIRTH:  13-Aug-1930  DATE OF ADMISSION:  08/28/2014  REFERRING PHYSICIAN: Gladstone Pihavid Schaevitz, M.D.   PRIMARY CARE PHYSICIAN: John B. Danne HarborWalker III, M.D.   ADMIT DIAGNOSIS: Left femur fracture.   HISTORY OF PRESENT ILLNESS: This is an 81 year old, Caucasian female, who presents to the Emergency Room with left hip pain. The patient states that she fell. She does not remember the circumstances of her fall, but denies hitting her head. She says that she thinks she had her walker with her when she lost her footing, but does not recall feeling dizzy prior to falling. She denies loss of consciousness, chest pain, shortness of breath, nausea, vomiting or diaphoresis. She, in the Emergency Department, was found to have an intertrochanteric fracture of the left femur, and the Emergency Department staff called for admission for preoperative clearance.   REVIEW OF SYSTEMS: The patient states that she is not in pain anywhere except for her left hip. She states that she has been eating well lately and has not felt ill. She denies feeling short of breath, and she denies any gastrointestinal or urinary symptoms. Beyond that, the patient is unable to recall or specify any specific complaints or pertinent negatives, and she is unable to contribute to the rest of her review of systems.   PAST MEDICAL HISTORY: Hypothyroidism, osteoporosis, dementia, and depression.   PAST SURGICAL HISTORY: None.   SOCIAL HISTORY: She states that she lives with her mother. I am unable to verify if this is accurate.   FAMILY HISTORY: Unavailable at this time.   MEDICATIONS:  1. Aspirin 81 mg 1 tablet p.o. daily.  2. Tylenol 500 mg 1 tablet p.o. every 4 hours as needed for pain.  3. Maalox 30 mL orally 4 times a day as needed for indigestion.   4. Divalproex sodium 125 mg delayed release capsule 1 capsule p.o. b.i.d.  5. Lorazepam 0.5 mg 1 tablet p.o. daily as needed for anxiety.   6. Trazodone 100 mg 1 tablet p.o. daily at bedtime.  7. Imodium 2 mg 1 tablet p.o. 8 times a day after each loose stool as needed.  8. Formula EM 15 mL every 2 to 3 hours as needed for nausea and vomiting.  9. Zofran 4 mg 1 tablet p.o. every 8 hours as needed for nausea and vomiting.  10. Cetirizine 10 mg 1 tablet p.o. daily.  11. Risamine 0.44%/20.625% topical ointment applied topically to the perineum 4 times a day.  12. Robitussin 100 mg/5 mL at  10 mL every 6 hours as needed for cough.  13. Polyethylene glycol 3350 oral powder for reconstitution 1 packet to 8 ounces of water daily.  14. Omeprazole 20 mg delayed release capsule 1 capsule p.o. daily.  15. Premarin vaginal cream 0.625 mg/gram with applicator use 2 times a week intravaginally.  16. Levothyroxine 100 mcg 1 tablet p.o. daily.  17. Vitamin D3 at 50,000 international units 1 capsule p.o. once per week on Thursday.   ALLERGIES: No known drug allergies.   PERTINENT LABORATORY RESULTS AND RADIOGRAPHIC FINDINGS: Serum glucose 119, BUN 17, creatinine 1.10, serum sodium 143, potassium 3.9, chloride 104, bicarbonate 27, calcium 9.1. Troponin is negative. White blood cell count 6.9, hemoglobin 13.5, hematocrit 41.1, platelet count is 188,000 MCV is 93. INR is 0.9.   Chest x-ray shows no displaced rib fractures. There is some chronic peribronchial thickening seen with mild bibasilar atelectasis.   X-ray of the left femur shows displaced  intertrochanteric fracture through the proximal left femur involving a portion of the greater femoral trochanter. There is some superior displacement of the distal femur and rotation of the proximal femur fragment. Also, the knee joint is not well visualized and further knee films are recommended.   X-ray of the anterior-posterior pelvis shows a displaced left femoral trochanteric fracture, as well as the left femoral head remained seated within the acetabulum.   PHYSICAL EXAMINATION:  VITAL SIGNS:  Temperature is 98.2, pulse is 98, respirations 18, blood pressure 174/98, pulse oximetry is 100% on room air.  GENERAL: The patient is alert and oriented x 1. She is in no apparent distress until she is rolled or moved, in which case, she explains that her hip hurts.  HEENT: Normocephalic, atraumatic. Pupils equal, round, and reactive to light and accommodation. Extraocular movements are intact. Mucous membranes are moist.  NECK: Trachea is midline. No adenopathy. Thyroid is nontender.  CHEST: Symmetric and atraumatic.  CARDIOVASCULAR: Regular rate and rhythm. Normal S1, S2. No rubs, clicks, or murmurs appreciated.  LUNGS: Clear to auscultation bilaterally. Normal effort and excursion.  ABDOMEN: Positive bowel sounds. Soft, nontender, nondistended. No hepatosplenomegaly.  GENITOURINARY: Deferred.  MUSCULOSKELETAL: The patient does not move her left leg, but has full range of motion in her right leg and both upper extremities.  SKIN: Warm and dry. There are no rashes or lesions.  NEUROLOGIC: Cranial nerves II through XII are grossly intact.  PSYCHIATRIC: Mood is normal. Affect is congruent. It is difficult to fully assess the patient's insight and judgment into her medical illnesses. She is demented.   ASSESSMENT AND PLAN: This is an 81 year old female admitted for a hip fracture.   1. Left femur fracture. Orthopedic surgery has been consulted. Our goal, for now, is pain management. She is a low to moderate risk due to her advanced age for non-thoracic surgery under general anesthesia.  2. Hypothyroidism. Continue levothyroxine.  3. Osteoporosis. We will check a vitamin D level and continue vitamin supplementation.  4. Depression, appears stable. Will continue Depakote.  5. Deep vein thrombosis prophylaxis with heparin. 6. Gastrointestinal prophylaxis, none, as the patient is not critically ill.   CODE STATUS: Currently, the patient is a FULL CODE. We may want a palliative care consult to  establish her code route going forward.   TIME SPENT ON ADMISSION ORDERS AND PATIENT CARE: Approximately 35 minutes.    ____________________________ Kelton Pillar. Sheryle Hail, MD msd:JT D: 08/28/2014 07:16:47 ET T: 08/28/2014 10:07:26 ET JOB#: [id number]  cc: Kelton Pillar. Sheryle Hail, MD, <Dictator> Kelton Pillar Belma Dyches MD ELECTRONICALLY SIGNED 08/28/2014 21:45

## 2015-08-06 IMAGING — CR DG HIP (WITH PELVIS) 1V*L*
5 series · 5 of 5 positions shown · non-contrast
Comparison: Preoperative study obtained earlier in the day

CLINICAL DATA: Open reduction internal fixation proximal femur
fracture

EXAM:
LEFT HIP  2 VIEW

[cont. (1 of 5)]
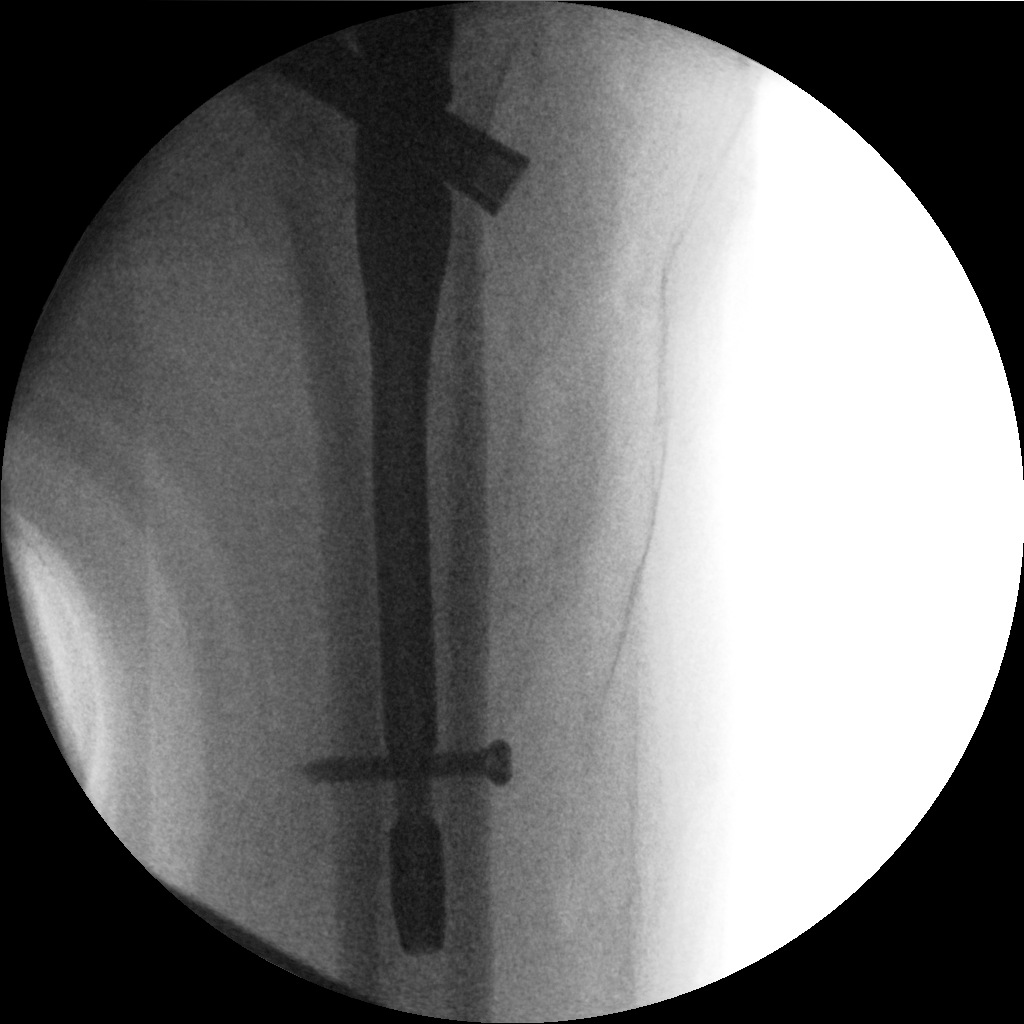

[cont. (2 of 5)]
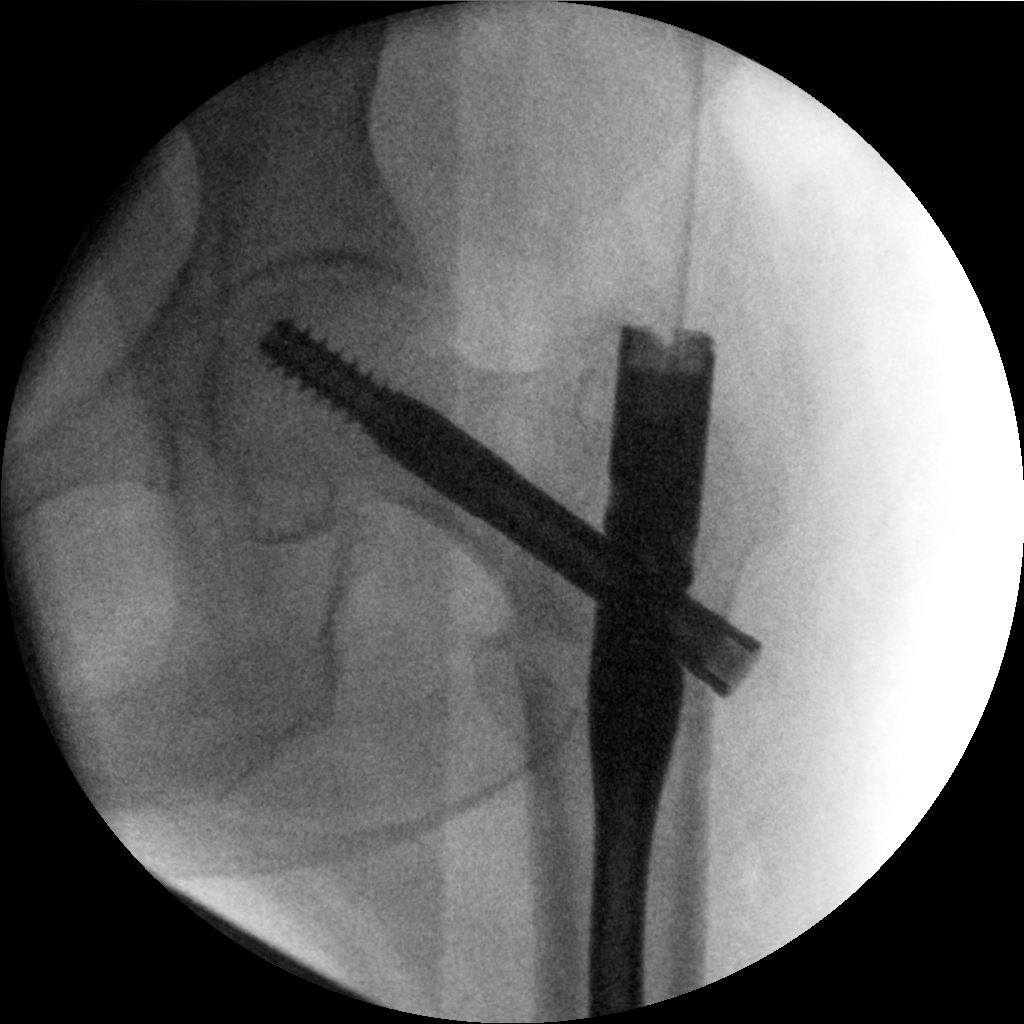

[cont. (3 of 5)]
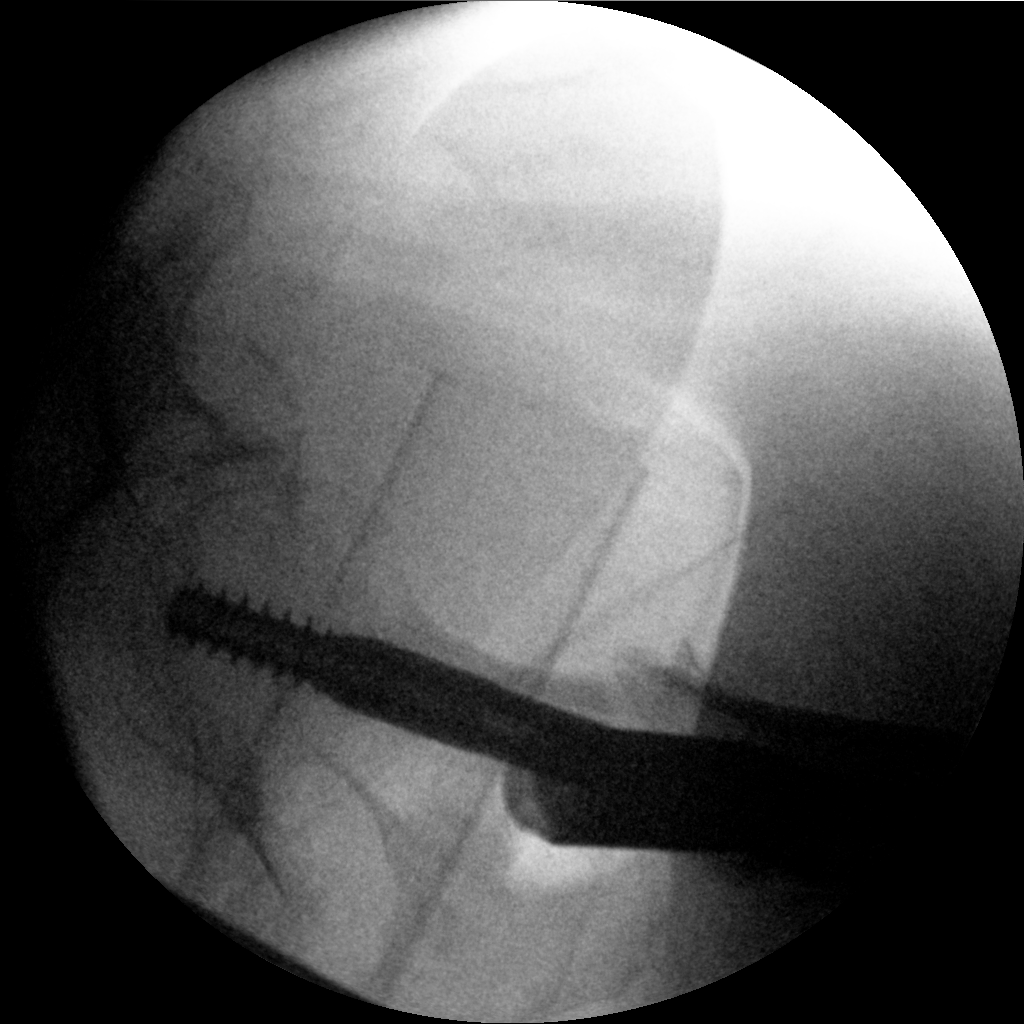

[cont. (4 of 5)]
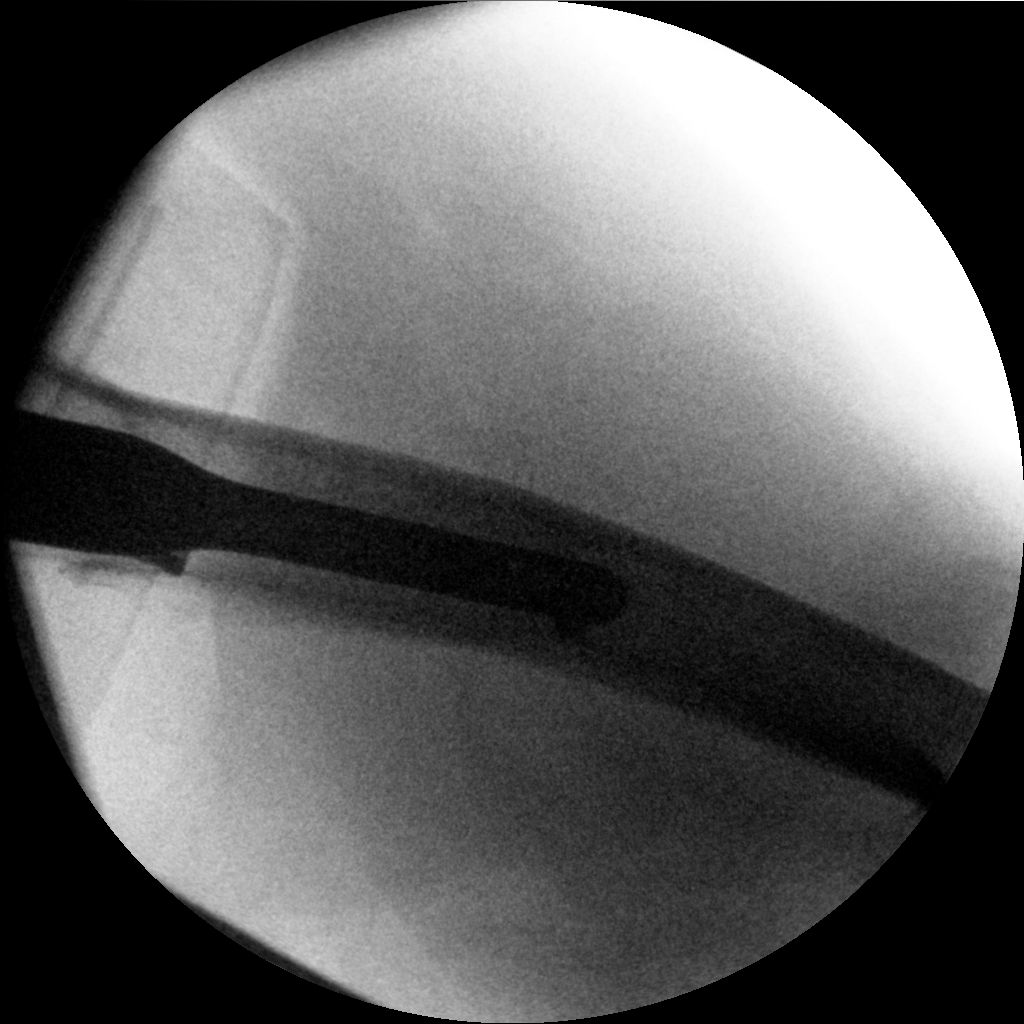

[cont. (5 of 5)]
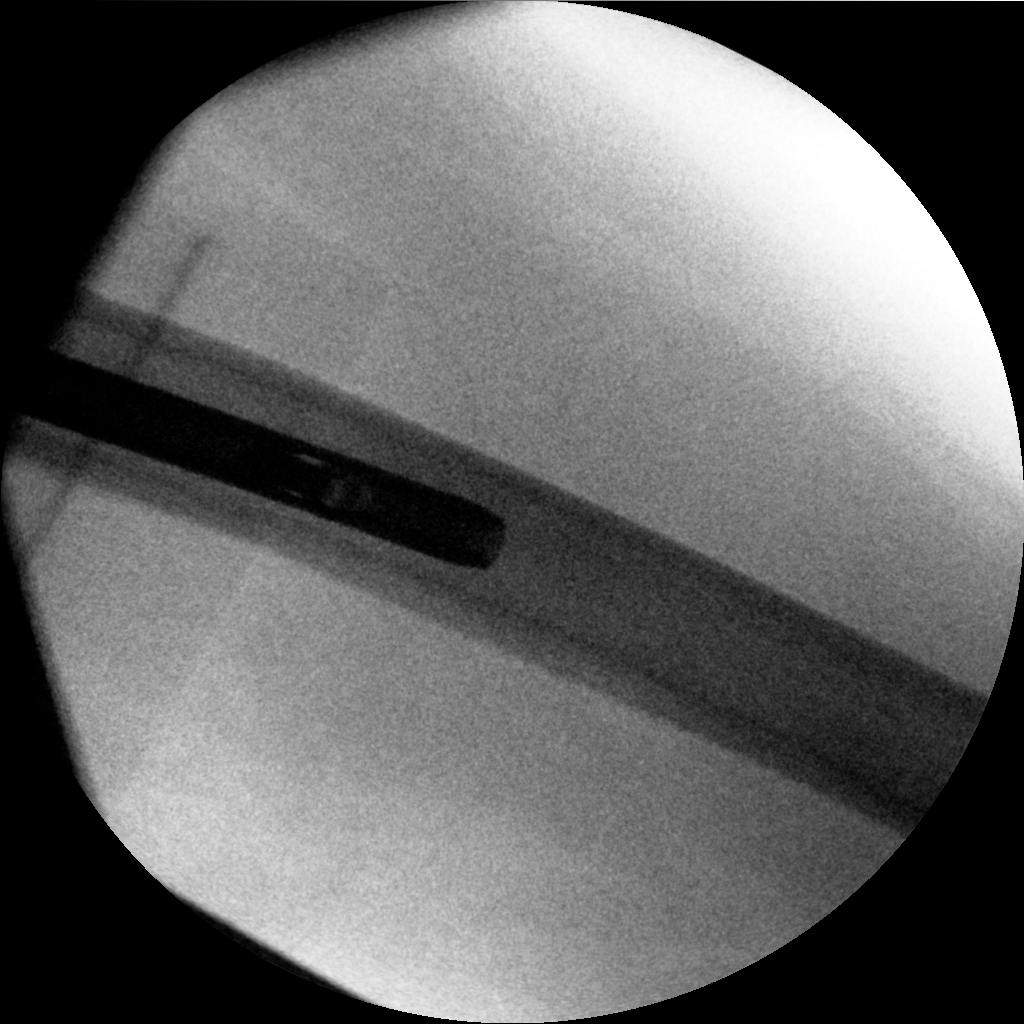

[5 of 5 positions shown; findings below may reference images not displayed]

FINDINGS: Frontal and lateral views were obtained. There is screw and nail
fixation through an intertrochanteric femur fracture on the left
with alignment essentially anatomic at the fracture site. The tip of
the screws in the proximal femoral head. No dislocation.
IMPRESSION: Alignment near anatomic at fracture site. Tip of screw in proximal
femoral head.

## 2016-12-13 ENCOUNTER — Ambulatory Visit: Payer: Self-pay | Admitting: Obstetrics and Gynecology

## 2016-12-18 ENCOUNTER — Encounter: Payer: Self-pay | Admitting: Obstetrics and Gynecology

## 2016-12-18 ENCOUNTER — Ambulatory Visit (INDEPENDENT_AMBULATORY_CARE_PROVIDER_SITE_OTHER): Payer: Medicare Other | Admitting: Obstetrics and Gynecology

## 2016-12-18 VITALS — BP 126/84 | HR 64

## 2016-12-18 DIAGNOSIS — N819 Female genital prolapse, unspecified: Secondary | ICD-10-CM

## 2016-12-18 MED ORDER — ZINC OXIDE 40 % EX OINT: 1.0000 "application " | TOPICAL_OINTMENT | CUTANEOUS | 0 refills | Status: AC | PRN

## 2016-12-18 MED ORDER — ESTROGENS, CONJUGATED 0.625 MG/GM VA CREA: 1.0000 | TOPICAL_CREAM | VAGINAL | 3 refills | Status: AC

## 2016-12-19 NOTE — Progress Notes (Signed)
     Obstetrics & Gynecology Office Visit   Chief Complaint:  Chief Complaint  Patient presents with  . Pessary Check    Pt brought in by EMS on gurney    History of Present Illness: Julia Gray is a 83 y.o. female who is seen today for a follow-up appointment for a  pessary check. She is using a size 3 ring with support pessary.  She denies vaginal bleeding.  She denies vaginal discharge.  She is voiding and defecating without difficulty.  History mostly obtained through her daughters.  Julia Gray is no longer ambulatory, and her dementia has slowly worsened.  She remains incontinent of urine and stool at times.  Given this and the fact that she is not ambulatory we had a long discussion of the pros and cons of continuation of the pessary.   Review of Systems: review of systems unable to be obtained secondary to patient's dementia  Past Medical History:  History reviewed. No pertinent past medical history.  Past Surgical History:  History reviewed. No pertinent surgical history.  Gynecologic History: No LMP recorded.  Obstetric History: No obstetric history on file.  Family History:  History reviewed. No pertinent family history.  Social History:  Social History   Social History  . Marital status: Married    Spouse name: N/A  . Number of children: N/A  . Years of education: N/A   Occupational History  . Not on file.   Social History Main Topics  . Smoking status: Never Smoker  . Smokeless tobacco: Never Used  . Alcohol use No  . Drug use: No  . Sexual activity: No   Other Topics Concern  . Not on file   Social History Narrative  . No narrative on file    Allergies:  Not on File  Medications: Prior to Admission medications   Medication Sig Start Date End Date Taking? Authorizing Provider  conjugated estrogens (PREMARIN) vaginal cream Place 1 Applicatorful vaginally 2 (two) times a week. 12/19/16   Vena AustriaStaebler, Chessica Audia, MD  liver oil-zinc oxide  (DESITIN) 40 % ointment Apply 1 application topically as needed for irritation. 12/18/16   Vena AustriaStaebler, Remy Voiles, MD    Physical Exam Vitals:  Vitals:   12/18/16 1209  BP: 126/84  Pulse: 64   No LMP recorded.  General: NAD HEENT: normocephalic, anicteric Pulmonary: No increased work of breathing Abdomen: NABS, soft, non-tender, non-distended.  Umbilicus without lesions.  No hepatomegaly, splenomegaly or masses palpable. No evidence of hernia  Genitourinary:  External: Normal external female genitalia.  Normal urethral meatus, normal  Bartholin's and Skene's glands.    Vagina: Normal vaginal mucosa, no evidence of prolapse.   Exam limited to patient being on EMS stretcher Extremities: no edema, erythema, or tenderness Neurologic: Grossly intact Psychiatric: mood appropriate, affect full  Female chaperone present for pelvic and breast  portions of the physical exam  Assessment: 83 y.o. with pelvic organ prolapse  Plan: Problem List Items Addressed This Visit    None    Visit Diagnoses    Female genital prolapse, unspecified type    -  Primary      - discontinue pessary - continue premarin cream twice weekly - desitin or barrier cream to prevent skin breakdown from incontinence

## 2018-12-14 DEATH — deceased
